# Patient Record
Sex: Male | Born: 1980 | Race: White | Hispanic: No | Marital: Married | Smoking: Former smoker
Health system: Southern US, Community
[De-identification: ages and names within clinical notes are randomized; demographics above are authoritative.]

## PROBLEM LIST (undated history)

## (undated) ENCOUNTER — Emergency Department (HOSPITAL_BASED_OUTPATIENT_CLINIC_OR_DEPARTMENT_OTHER): Admission: EM | Payer: Self-pay | Source: Home / Self Care

## (undated) DIAGNOSIS — J449 Chronic obstructive pulmonary disease, unspecified: Secondary | ICD-10-CM

## (undated) DIAGNOSIS — I10 Essential (primary) hypertension: Secondary | ICD-10-CM

## (undated) DIAGNOSIS — I219 Acute myocardial infarction, unspecified: Secondary | ICD-10-CM

## (undated) HISTORY — PX: CHEST TUBE INSERTION: SHX231

## (undated) HISTORY — PX: ELBOW SURGERY: SHX618

---

## 2013-10-21 ENCOUNTER — Emergency Department
Admission: EM | Admit: 2013-10-21 | Discharge: 2013-10-21 | Disposition: A | Discharge status: Home / Self Care | Payer: Self-pay | Source: Home / Self Care

## 2013-10-21 ENCOUNTER — Encounter: Payer: Self-pay | Admitting: Emergency Medicine

## 2013-10-21 ENCOUNTER — Telehealth: Payer: Self-pay | Admitting: *Deleted

## 2013-10-21 DIAGNOSIS — L03319 Cellulitis of trunk, unspecified: Secondary | ICD-10-CM

## 2013-10-21 DIAGNOSIS — L02211 Cutaneous abscess of abdominal wall: Secondary | ICD-10-CM

## 2013-10-21 DIAGNOSIS — L02219 Cutaneous abscess of trunk, unspecified: Secondary | ICD-10-CM

## 2013-10-21 MED ORDER — DOXYCYCLINE HYCLATE 100 MG PO CAPS: 100.0000 mg | ORAL_CAPSULE | Freq: Two times a day (BID) | ORAL | Status: DC

## 2013-10-21 MED ORDER — HYDROCODONE-ACETAMINOPHEN 5-325 MG PO TABS: 1.0000 | ORAL_TABLET | ORAL | Status: DC | PRN

## 2013-10-21 NOTE — ED Notes (Unsigned)
pt called reports that the redness around his abscess is bigger than when he was here today and he also c/o increased pain. per Dr Orson AloeHenderson advised the pt if he would like he could come back in for an injection of rocephin 1G IM. if significantly worsens proceed to the ED.

## 2013-10-21 NOTE — ED Notes (Signed)
Pt c/o RLQ abd abscess with a red streak to his groin area x 3 days. He reports a fever yesterday. He reports a hx of similar abscesses in the past.

## 2013-10-21 NOTE — ED Provider Notes (Signed)
CSN: [id number]     Arrival date & time 10/21/13  1356 History   None    Chief Complaint  Patient presents with  . Abscess   (Consider location/radiation/quality/duration/timing/severity/associated sxs/prior Treatment) Patient is a 33 y.o. male presenting with abscess. The history is provided by the patient.  Abscess Abscess location: Skin of Right lower abdominal wall. Size:  3 cm Abscess quality: draining, fluctuance, painful, redness and warmth   Red streaking: yes   Duration:  2 days Progression:  Worsening Pain details:    Quality:  Dull and hot   Severity:  Moderate Chronicity:  New Context: not diabetes, not immunosuppression, not insect bite/sting and not skin injury   Relieved by:  Nothing Worsened by:  Draining/squeezing Associated symptoms: fatigue and fever   Associated symptoms: no nausea and no vomiting   Risk factors: prior abscess (Once many years ago that was effectively drained)   Risk factors: no hx of MRSA     History reviewed. No pertinent past medical history. Past Surgical History  Procedure Laterality Date  . Chest tube insertion    . Elbow surgery     History reviewed. No pertinent family history. History  Substance Use Topics  . Smoking status: Former Games developer  . Smokeless tobacco: Not on file  . Alcohol Use: Yes    Review of Systems  Constitutional: Positive for fever and fatigue.  Gastrointestinal: Negative for nausea and vomiting.  All other systems reviewed and are negative.    Allergies  Review of patient's allergies indicates no known allergies.  Home Medications   Current Outpatient Rx  Name  Route  Sig  Dispense  Refill  . doxycycline (VIBRAMYCIN) 100 MG capsule   Oral   Take 1 capsule (100 mg total) by mouth 2 (two) times daily.   20 capsule   0   . HYDROcodone-acetaminophen (NORCO/VICODIN) 5-325 MG per tablet   Oral   Take 1-2 tablets by mouth every 4 (four) hours as needed for severe pain. Take with food.   15  tablet   0    BP 146/92  Pulse 88  Temp(Src) 98.1 F (36.7 C) (Oral)  Resp 18  Ht 5\' 10"  (1.778 m)  Wt 183 lb (83.008 kg)  BMI 26.26 kg/m2  SpO2 99% Physical Exam  Nursing note and vitals reviewed. Constitutional: He is oriented to person, place, and time. He appears well-developed and well-nourished. He is cooperative.  Non-toxic appearance. He appears ill. No distress.  Moderately uncomfortable from skin infection right lower abdomen.  He is ambulatory and can walk on his own  HENT:  Head: Normocephalic and atraumatic.  Eyes: Conjunctivae and EOM are normal. Pupils are equal, round, and reactive to light. No scleral icterus.  Neck: Normal range of motion.  Cardiovascular: Normal rate.   Pulmonary/Chest: Effort normal.  Abdominal: He exhibits no distension.  Musculoskeletal: Normal range of motion. He exhibits no edema and no tenderness.  No leg or calf tenderness or swelling or induration.  Lymphadenopathy:       Right: Inguinal adenopathy present.  Neurological: He is alert and oriented to person, place, and time.  Skin: Skin is warm.     3 cm area of redness, heat, exquisite tenderness, fluctuance.--In the Center is a small pustule. Surrounding this 3 cm area is a 5 cm in diameter area of heat, tenderness, induration. There are a few red streaks, extending another 3 cm but not beyond.   Psychiatric: He has a normal mood and affect.  ED Course  INCISION AND DRAINAGE Date/Time: 10/21/2013 2:28 PM Performed by: Georgina PillionMASSEY, DAVID Authorized by: Georgina PillionMASSEY, DAVID Consent: Verbal consent obtained. Risks and benefits: risks, benefits and alternatives were discussed Consent given by: patient Patient understanding: patient states understanding of the procedure being performed Patient identity confirmed: verbally with patient Time out: Immediately prior to procedure a "time out" was called to verify the correct patient, procedure, equipment, support staff and site/side marked as  required. Type: abscess Body area: trunk Location details: abdomen Local anesthetic: lidocaine 1% with epinephrine Anesthetic total: 4 ml Patient sedated: no Scalpel size: 11 Incision type: single straight Complexity: simple Drainage: purulent and serosanguinous Drainage amount: copious Wound treatment: wound left open Packing material: 1/2 in iodoform gauze Patient tolerance: Patient tolerated the procedure well with no immediate complications.   Labs Review Labs Reviewed  WOUND CULTURE   Imaging Review No results found.  EKG Interpretation    Date/Time:    Ventricular Rate:    PR Interval:    QRS Duration:   QT Interval:    QTC Calculation:   R Axis:     Text Interpretation:              MDM   1. Cutaneous abscess of abdominal wall   2. Cellulitis and abscess of trunk    After risks, benefits, alternatives discussed, patient consented and agreed to incision and drainage, described in detail above. Wound culture sent.   Iodoform gauze placed into the abscess cavity . He tolerated well. Thick gauze dressing applied. Wound care instructions given. Keep clean and dry Return here to urgent care 2 days for recheck and removal/partial removal of iodoform gauze.  I advised shot of Rocephin, but he declined .  Rxs:   Doxycycline 100 mg twice a day x10 days Vicodin. #15. No refills   Precautions discussed. Red flags discussed. Questions invited and answered. Patient voiced understanding and agreement.    Lajean Manesavid Massey, MD 10/21/13 [phone]

## 2013-10-23 ENCOUNTER — Emergency Department
Admission: EM | Admit: 2013-10-23 | Discharge: 2013-10-23 | Disposition: A | Discharge status: Home / Self Care | Payer: Self-pay | Source: Home / Self Care | Attending: Family Medicine | Admitting: Family Medicine

## 2013-10-23 DIAGNOSIS — Z5189 Encounter for other specified aftercare: Secondary | ICD-10-CM

## 2013-10-23 NOTE — ED Provider Notes (Signed)
CSN: [id number]     Arrival date & time 10/23/13  1244 History   First MD Initiated Contact with Patient 10/23/13 1246     Chief Complaint  Patient presents with  . Wound Check    HPI Comments: Pt here for wound recheck.  Had R lower abdomen abscess.  I and D'd at bedside.  Wound cx still pending. No growth today No fevers or chills.  Sxs overall gradually improving.  Mild drainage.   Patient is a 33 y.o. male presenting with wound check. The history is provided by the patient.  Wound Check This is a new problem. The current episode started more than 2 days ago. The problem has been gradually improving.    No past medical history on file. Past Surgical History  Procedure Laterality Date  . Chest tube insertion    . Elbow surgery     No family history on file. History  Substance Use Topics  . Smoking status: Former Games developermoker  . Smokeless tobacco: Not on file  . Alcohol Use: Yes    Review of Systems  All other systems reviewed and are negative.    Allergies  Review of patient's allergies indicates no known allergies.  Home Medications   Current Outpatient Rx  Name  Route  Sig  Dispense  Refill  . doxycycline (VIBRAMYCIN) 100 MG capsule   Oral   Take 1 capsule (100 mg total) by mouth 2 (two) times daily.   20 capsule   0   . HYDROcodone-acetaminophen (NORCO/VICODIN) 5-325 MG per tablet   Oral   Take 1-2 tablets by mouth every 4 (four) hours as needed for severe pain. Take with food.   15 tablet   0    BP 153/90  Pulse 98  Temp(Src) 97.7 F (36.5 C) (Oral)  Resp 14  SpO2 100% Physical Exam  Constitutional: He appears well-developed and well-nourished.  HENT:  Head: Normocephalic and atraumatic.  Eyes: Conjunctivae are normal. Pupils are equal, round, and reactive to light.  Neck: Normal range of motion. Neck supple.  Cardiovascular: Normal rate and regular rhythm.   Pulmonary/Chest: Effort normal and breath sounds normal.  Abdominal:    R lower  abdomen abscess Packing in place.  Mild TTP   Neurological: He is alert.  Skin: Rash noted.    ED Course  Procedures (including critical care time) Labs Review Labs Reviewed - No data to display Imaging Review No results found.  EKG Interpretation    Date/Time:    Ventricular Rate:    PR Interval:    QRS Duration:   QT Interval:    QTC Calculation:   R Axis:     Text Interpretation:              MDM   1. Wound check, abscess    Packing removed and replaced.  Continue doxycycline Plan for follow up her in clinic in 2 days for packing removal/reassessment.  Discussed skin and infectious red flags at length. Follow up as needed.     The patient and/or caregiver has been counseled thoroughly with regard to treatment plan and/or medications prescribed including dosage, schedule, interactions, rationale for use, and possible side effects and they verbalize understanding. Diagnoses and expected course of recovery discussed and will return if not improved as expected or if the condition worsens. Patient and/or caregiver verbalized understanding.         Doree AlbeeSteven Baneen Wieseler, MD 10/23/13 [phone]

## 2013-10-23 NOTE — ED Notes (Signed)
Ronnie Doyle is here for f/u of abscess on lower abdomen. Denies fevr, chills or body aches. Taking antibiotic as prescribed. He has has to change the dressing once due to drainage.

## 2013-10-24 LAB — WOUND CULTURE
Gram Stain: NONE SEEN
Gram Stain: NONE SEEN

## 2013-10-25 ENCOUNTER — Emergency Department (INDEPENDENT_AMBULATORY_CARE_PROVIDER_SITE_OTHER)
Admission: EM | Admit: 2013-10-25 | Discharge: 2013-10-25 | Disposition: A | Discharge status: Home / Self Care | Payer: Self-pay | Source: Home / Self Care | Attending: Family Medicine | Admitting: Family Medicine

## 2013-10-25 ENCOUNTER — Encounter: Payer: Self-pay | Admitting: Emergency Medicine

## 2013-10-25 DIAGNOSIS — Z4801 Encounter for change or removal of surgical wound dressing: Secondary | ICD-10-CM

## 2013-10-25 MED ORDER — DOXYCYCLINE HYCLATE 100 MG PO CAPS: 100.0000 mg | ORAL_CAPSULE | Freq: Two times a day (BID) | ORAL | Status: DC

## 2013-10-25 NOTE — ED Notes (Signed)
For follow up wound check lower abdomen.

## 2013-10-25 NOTE — Discharge Instructions (Signed)
Change bandage daily until healed.   SEEK MEDICAL CARE IF:  Your skin around the wound looks red.  Your wound feels more tender or sore.  You see pus in the wound.  Your wound smells bad.  You have a fever.  Your skin around the wound has a rash that itches and burns.  You see black or yellow skin in your wound that was not there before.  You feel nauseous, throw up, and feel very tired. Document Released: 10/19/2004 Document Revised: 12/04/2011 Document Reviewed: 07/24/2011 [facility] Patient Information 2014 [employer], [place].

## 2013-10-25 NOTE — ED Provider Notes (Signed)
CSN: [id number]     Arrival date & time 10/25/13  1252 History   First MD Initiated Contact with Patient 10/25/13 1310     Chief complaint:  Returns for dressing change    HPI Comments: Patient has no complaints.  He has minimal pain at abscess site.  No fevers, chills, and sweats.  The history is provided by the patient.    No past medical history on file. Past Surgical History  Procedure Laterality Date  . Chest tube insertion    . Elbow surgery     No family history on file. History  Substance Use Topics  . Smoking status: Former Games developermoker  . Smokeless tobacco: Not on file  . Alcohol Use: Yes    Review of Systems Patient denies fevers, chills, and sweats.   Allergies  Review of patient's allergies indicates no known allergies.  Home Medications   Current Outpatient Rx  Name  Route  Sig  Dispense  Refill  . doxycycline (VIBRAMYCIN) 100 MG capsule   Oral   Take 1 capsule (100 mg total) by mouth 2 (two) times daily.   14 capsule   0   . HYDROcodone-acetaminophen (NORCO/VICODIN) 5-325 MG per tablet   Oral   Take 1-2 tablets by mouth every 4 (four) hours as needed for severe pain. Take with food.   15 tablet   0    There were no vitals taken for this visit. Physical Exam Nursing notes and Vital Signs reviewed. Appearance:  Patient appears healthy, stated age, and in no acute distress Eyes:  Pupils are equal, round, and reactive to light and accomodation.  Extraocular movement is intact.  Conjunctivae are not inflamed  Heart:  Regular rate and rhythm without murmurs, rubs, or gallops.  Abdomen:  Nontender without masses or hepatosplenomegaly.  Bowel sounds are present.  No CVA or flank tenderness.  Skin:  Wound right lower abdomen has minimal erythema present.  Removed packing; minimal drainage present.  Wound about 1cm long and 5mm deep.  No need for packing today.  Bandage applied  ED Course  Procedures  none          MDM   1. Dressing change or removal,  surgical wound    Continue doxycycline until wound healed Change bandage daily until healed. Return if worsens.    Lattie HawStephen A Jaloni Sorber, MD 10/27/13 [phone]

## 2013-11-06 ENCOUNTER — Emergency Department (INDEPENDENT_AMBULATORY_CARE_PROVIDER_SITE_OTHER)
Admission: EM | Admit: 2013-11-06 | Discharge: 2013-11-06 | Disposition: A | Discharge status: Home / Self Care | Payer: Self-pay | Source: Home / Self Care | Attending: Family Medicine | Admitting: Family Medicine

## 2013-11-06 ENCOUNTER — Encounter: Payer: Self-pay | Admitting: Emergency Medicine

## 2013-11-06 ENCOUNTER — Telehealth: Payer: Self-pay | Admitting: *Deleted

## 2013-11-06 DIAGNOSIS — L03319 Cellulitis of trunk, unspecified: Secondary | ICD-10-CM

## 2013-11-06 DIAGNOSIS — L02219 Cutaneous abscess of trunk, unspecified: Secondary | ICD-10-CM

## 2013-11-06 DIAGNOSIS — Z5189 Encounter for other specified aftercare: Secondary | ICD-10-CM

## 2013-11-06 DIAGNOSIS — Z4800 Encounter for change or removal of nonsurgical wound dressing: Secondary | ICD-10-CM

## 2013-11-06 LAB — POCT CBC W AUTO DIFF (K'VILLE URGENT CARE)

## 2013-11-06 MED ORDER — DOXYCYCLINE HYCLATE 100 MG PO CAPS: 100.0000 mg | ORAL_CAPSULE | Freq: Two times a day (BID) | ORAL | Status: DC

## 2013-11-06 NOTE — ED Notes (Signed)
Ronnie Doyle was last seen for a f/u of abscess on 10/27/13. Site has healed and closed. He still c/o pain at the site and area of hardness surrounding. Denies fever or chills. Site negative for redness.

## 2013-11-06 NOTE — ED Provider Notes (Signed)
CSN: [id number]     Arrival date & time 11/06/13  1217 History   First MD Initiated Contact with Patient 11/06/13 1222     Chief Complaint  Patient presents with  . Wound Check      HPI  Pt presents today for wound recheck  Initially seen 1/27 for R lower abd abscess.  Was i and d'd. Pt placed on doxy.  Wound cx showed staph sensitive to doxy.  Last seen 1/31. Packing removed.  Pt states that he has had persistent pain and irritation in affected area.  No fevers or chills.   History reviewed. No pertinent past medical history. Past Surgical History  Procedure Laterality Date  . Chest tube insertion    . Elbow surgery     History reviewed. No pertinent family history. History  Substance Use Topics  . Smoking status: Former Games developermoker  . Smokeless tobacco: Not on file  . Alcohol Use: Yes    Review of Systems  All other systems reviewed and are negative.      Allergies  Review of patient's allergies indicates no known allergies.  Home Medications   Current Outpatient Rx  Name  Route  Sig  Dispense  Refill  . doxycycline (VIBRAMYCIN) 100 MG capsule   Oral   Take 1 capsule (100 mg total) by mouth 2 (two) times daily.   14 capsule   0   . HYDROcodone-acetaminophen (NORCO/VICODIN) 5-325 MG per tablet   Oral   Take 1-2 tablets by mouth every 4 (four) hours as needed for severe pain. Take with food.   15 tablet   0    BP 144/97  Pulse 101  Temp(Src) 98.6 F (37 C) (Oral)  Resp 14  SpO2 99% Physical Exam  Constitutional: He appears well-developed and well-nourished.  HENT:  Head: Normocephalic and atraumatic.  Eyes: Conjunctivae are normal. Pupils are equal, round, and reactive to light.  Neck: Normal range of motion. Neck supple.  Cardiovascular: Normal rate and regular rhythm.   Pulmonary/Chest: Effort normal and breath sounds normal.  Abdominal: Soft.    R lower abdominal wound, healed with scar formation, induration, mild peripheral erythema.      Musculoskeletal: Normal range of motion.  Neurological: He is alert.  Skin: Skin is warm.    ED Course  Procedures (including critical care time) Labs Review Labs Reviewed  POCT CBC W AUTO DIFF ([facility])    Imaging Review No results found.    MDM   Final diagnoses:  Wound check, abscess    WBC WNL today @ 6.9 DDx includes scar formation vs. Abscess recurrence.  Infection less likely given normal WBC and no other systemic sxs.  Unclear if there an underlying cyst/scar tissue complicating area.  Will send to surgery for reevaluation  PPx rx for doxy given if pt spikes fever. Hold off in the interim given no systemic sxs.  Pt expressed understanding of plan.  Follow up as needed.     The patient and/or caregiver has been counseled thoroughly with regard to treatment plan and/or medications prescribed including dosage, schedule, interactions, rationale for use, and possible side effects and they verbalize understanding. Diagnoses and expected course of recovery discussed and will return if not improved as expected or if the condition worsens. Patient and/or caregiver verbalized understanding.         Doree AlbeeSteven Nykiah Ma, MD 11/06/13 [phone]

## 2015-09-26 ENCOUNTER — Encounter: Payer: Self-pay | Admitting: Emergency Medicine

## 2015-09-26 ENCOUNTER — Emergency Department (INDEPENDENT_AMBULATORY_CARE_PROVIDER_SITE_OTHER): Payer: BLUE CROSS/BLUE SHIELD

## 2015-09-26 ENCOUNTER — Emergency Department
Admission: EM | Admit: 2015-09-26 | Discharge: 2015-09-26 | Disposition: A | Discharge status: Home / Self Care | Payer: BLUE CROSS/BLUE SHIELD | Source: Home / Self Care | Attending: Family Medicine | Admitting: Family Medicine

## 2015-09-26 DIAGNOSIS — J439 Emphysema, unspecified: Secondary | ICD-10-CM

## 2015-09-26 DIAGNOSIS — J438 Other emphysema: Secondary | ICD-10-CM

## 2015-09-26 DIAGNOSIS — R05 Cough: Secondary | ICD-10-CM

## 2015-09-26 DIAGNOSIS — R053 Chronic cough: Secondary | ICD-10-CM

## 2015-09-26 HISTORY — DX: Essential (primary) hypertension: I10

## 2015-09-26 MED ORDER — PREDNISONE 20 MG PO TABS: ORAL_TABLET | ORAL | Status: DC

## 2015-09-26 MED ORDER — BENZONATATE 100 MG PO CAPS: 100.0000 mg | ORAL_CAPSULE | Freq: Three times a day (TID) | ORAL | Status: DC

## 2015-09-26 MED ORDER — DOXYCYCLINE HYCLATE 100 MG PO CAPS: 100.0000 mg | ORAL_CAPSULE | Freq: Two times a day (BID) | ORAL | Status: DC

## 2015-09-26 MED ORDER — ALBUTEROL SULFATE HFA 108 (90 BASE) MCG/ACT IN AERS: 1.0000 | INHALATION_SPRAY | Freq: Four times a day (QID) | RESPIRATORY_TRACT | Status: AC | PRN

## 2015-09-26 NOTE — ED Notes (Signed)
Pt c/o productive cough x 3 weeks, tight band feeling around lungs, some difficulty breathing, worse in the am and pm.

## 2015-09-26 NOTE — Discharge Instructions (Signed)

## 2015-09-26 NOTE — ED Provider Notes (Signed)
CSN: [id number]     Arrival date & time 09/26/15  1617 History   First MD Initiated Contact with Patient 09/26/15 1631     Chief Complaint  Patient presents with  . Cough   (Consider location/radiation/quality/duration/timing/severity/associated sxs/prior Treatment) HPI Pt is a 35yo male presenting to [facility] with c/o 3 weeks of moderately productive cough with associated chest tightness and shortness of breath.  Cough is worse in the morning and at night, improves throughout the day. He has been trying OTC cough medication without relief. Denies hx of asthma. Pt is a former smoker. He reports a subjective fever. Denies n/v/d.   Past Medical History  Diagnosis Date  . Hypertension    Past Surgical History  Procedure Laterality Date  . Chest tube insertion    . Elbow surgery     No family history on file. Social History  Substance Use Topics  . Smoking status: Former Games developer  . Smokeless tobacco: None  . Alcohol Use: Yes    Review of Systems  Constitutional: Positive for fever, chills and fatigue.  HENT: Positive for congestion, postnasal drip, rhinorrhea, sinus pressure and sore throat. Negative for ear pain, trouble swallowing and voice change.   Respiratory: Positive for cough, chest tightness, shortness of breath and wheezing.   Cardiovascular: Positive for chest pain. Negative for palpitations.  Gastrointestinal: Negative for nausea, vomiting, abdominal pain and diarrhea.  Musculoskeletal: Negative for myalgias, back pain and arthralgias.  Skin: Negative for rash.  Neurological: Positive for dizziness and headaches. Negative for light-headedness.    Allergies  Review of patient's allergies indicates no known allergies.  Home Medications   Prior to Admission medications   Medication Sig Start Date End Date Taking? Authorizing Provider  tapentadol (NUCYNTA) 50 MG TABS tablet Take 50 mg by mouth.   Yes Historical Provider, MD  albuterol (PROVENTIL HFA;VENTOLIN HFA) 108 (90  Base) MCG/ACT inhaler Inhale 1-2 puffs into the lungs every 6 (six) hours as needed for wheezing or shortness of breath. 09/26/15   Ronnie Finner, PA-C  benzonatate (TESSALON) 100 MG capsule Take 1 capsule (100 mg total) by mouth every 8 (eight) hours. 09/26/15   Ronnie Finner, PA-C  doxycycline (VIBRAMYCIN) 100 MG capsule Take 1 capsule (100 mg total) by mouth 2 (two) times daily. One po bid x 7 days 09/26/15   Ronnie Finner, PA-C  predniSONE (DELTASONE) 20 MG tablet 2 tabs po daily x 3 days 09/26/15   Ronnie Finner, PA-C   Meds Ordered and Administered this Visit  Medications - No data to display  BP 153/100 mmHg  Pulse 104  Temp(Src) 98.1 F (36.7 C) (Oral)  Ht 5\' 10"  (1.778 m)  Wt 176 lb 8 oz (80.06 kg)  BMI 25.33 kg/m2  SpO2 100% No data found.   Physical Exam  Constitutional: He appears well-developed and well-nourished.  HENT:  Head: Normocephalic and atraumatic.  Right Ear: Hearing, tympanic membrane, external ear and ear canal normal.  Left Ear: Hearing, external ear and ear canal normal. A middle ear effusion is present.  Nose: Nose normal. Right sinus exhibits no maxillary sinus tenderness and no frontal sinus tenderness. Left sinus exhibits no maxillary sinus tenderness and no frontal sinus tenderness.  Mouth/Throat: Uvula is midline and mucous membranes are normal. Posterior oropharyngeal erythema present. No oropharyngeal exudate, posterior oropharyngeal edema or tonsillar abscesses.  Eyes: Conjunctivae are normal. No scleral icterus.  Neck: Normal range of motion. Neck supple.  Cardiovascular: Regular rhythm and normal heart sounds.  Tachycardia present.  Pulmonary/Chest: Effort normal. No respiratory distress. He has wheezes. He has no rales. He exhibits no tenderness.  Faint expiratory wheeze on exam. No respiratory distress. Able to speak in full sentences.   Abdominal: Soft. Bowel sounds are normal. He exhibits no distension and no mass. There is no tenderness. There is no  rebound and no guarding.  Musculoskeletal: Normal range of motion.  Neurological: He is alert.  Skin: Skin is warm and dry.  Nursing note and vitals reviewed.   ED Course  Procedures (including critical care time)  Labs Review Labs Reviewed - No data to display  Imaging Review Dg Chest 2 View  09/26/2015  CLINICAL DATA:  Productive cough and shortness of breath. EXAM: CHEST  2 VIEW COMPARISON:  None. FINDINGS: The patient has chronic obstructive lung disease with flattening of the diaphragm. No infiltrates or effusions. Heart size and vascularity are normal. No acute osseous abnormality. IMPRESSION: Emphysema.  No acute abnormalities. Electronically Signed   By: Francene BoyersJames  Maxwell M.D.   On: 09/26/2015 17:05     MDM   1. Persistent cough for 3 weeks or longer   2. Other emphysema (HCC)     Pt c/o 3 weeks persistent productive cough. No hx of asthma. Pt is a former smoker.  No respiratory distress on exam. O2 Sat 100% mild tachycardia.   CXR: no evidence of pneumonia, findings c/w emphysema.    Rx: doxycycline, prednisone 40mg  x 3 days, albuterol and tessalon. Pt was unaware of any hx of COPD.  He notes he has his a physical coming up with his new PCP on Jan 11th.  Encouraged pt to f/u with PCP, may call to see if he can make an earlier appointment if symptoms continue to worse. Information packet on inhaler use and emphysema provided.  Patient verbalized understanding and agreement with treatment plan.    Ronnie FinnerErin O'Malley, PA-C 09/27/15 [phone]

## 2016-09-25 ENCOUNTER — Encounter: Payer: Self-pay | Admitting: Emergency Medicine

## 2016-09-25 ENCOUNTER — Emergency Department (INDEPENDENT_AMBULATORY_CARE_PROVIDER_SITE_OTHER): Payer: Self-pay

## 2016-09-25 ENCOUNTER — Emergency Department
Admission: EM | Admit: 2016-09-25 | Discharge: 2016-09-25 | Disposition: A | Discharge status: Home / Self Care | Payer: BLUE CROSS/BLUE SHIELD | Source: Home / Self Care | Attending: Family Medicine | Admitting: Family Medicine

## 2016-09-25 DIAGNOSIS — R053 Chronic cough: Secondary | ICD-10-CM

## 2016-09-25 DIAGNOSIS — R05 Cough: Secondary | ICD-10-CM | POA: Diagnosis not present

## 2016-09-25 MED ORDER — PREDNISONE 20 MG PO TABS: ORAL_TABLET | ORAL | 0 refills | Status: DC

## 2016-09-25 MED ORDER — AZITHROMYCIN 250 MG PO TABS: ORAL_TABLET | ORAL | 0 refills | Status: DC

## 2016-09-25 MED ORDER — BENZONATATE 200 MG PO CAPS: ORAL_CAPSULE | ORAL | 0 refills | Status: DC

## 2016-09-25 NOTE — ED Triage Notes (Signed)
Patient has had cough for over 6 weeks with history of serious lung problems.

## 2016-09-25 NOTE — Discharge Instructions (Signed)
Take plain guaifenesin (1200mg  extended release tabs such as Mucinex) twice daily, with plenty of water, for cough and congestion.  Get adequate rest.     Begin Azithromycin if not improving about one week or if persistent fever develops
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
## 2017-10-16 IMAGING — DX DG CHEST 2V
2 series · 2 of 2 positions shown · non-contrast
Comparison: 09/26/2015

CLINICAL DATA: Cough and congestion

EXAM:
CHEST  2 VIEW

[chest pa]
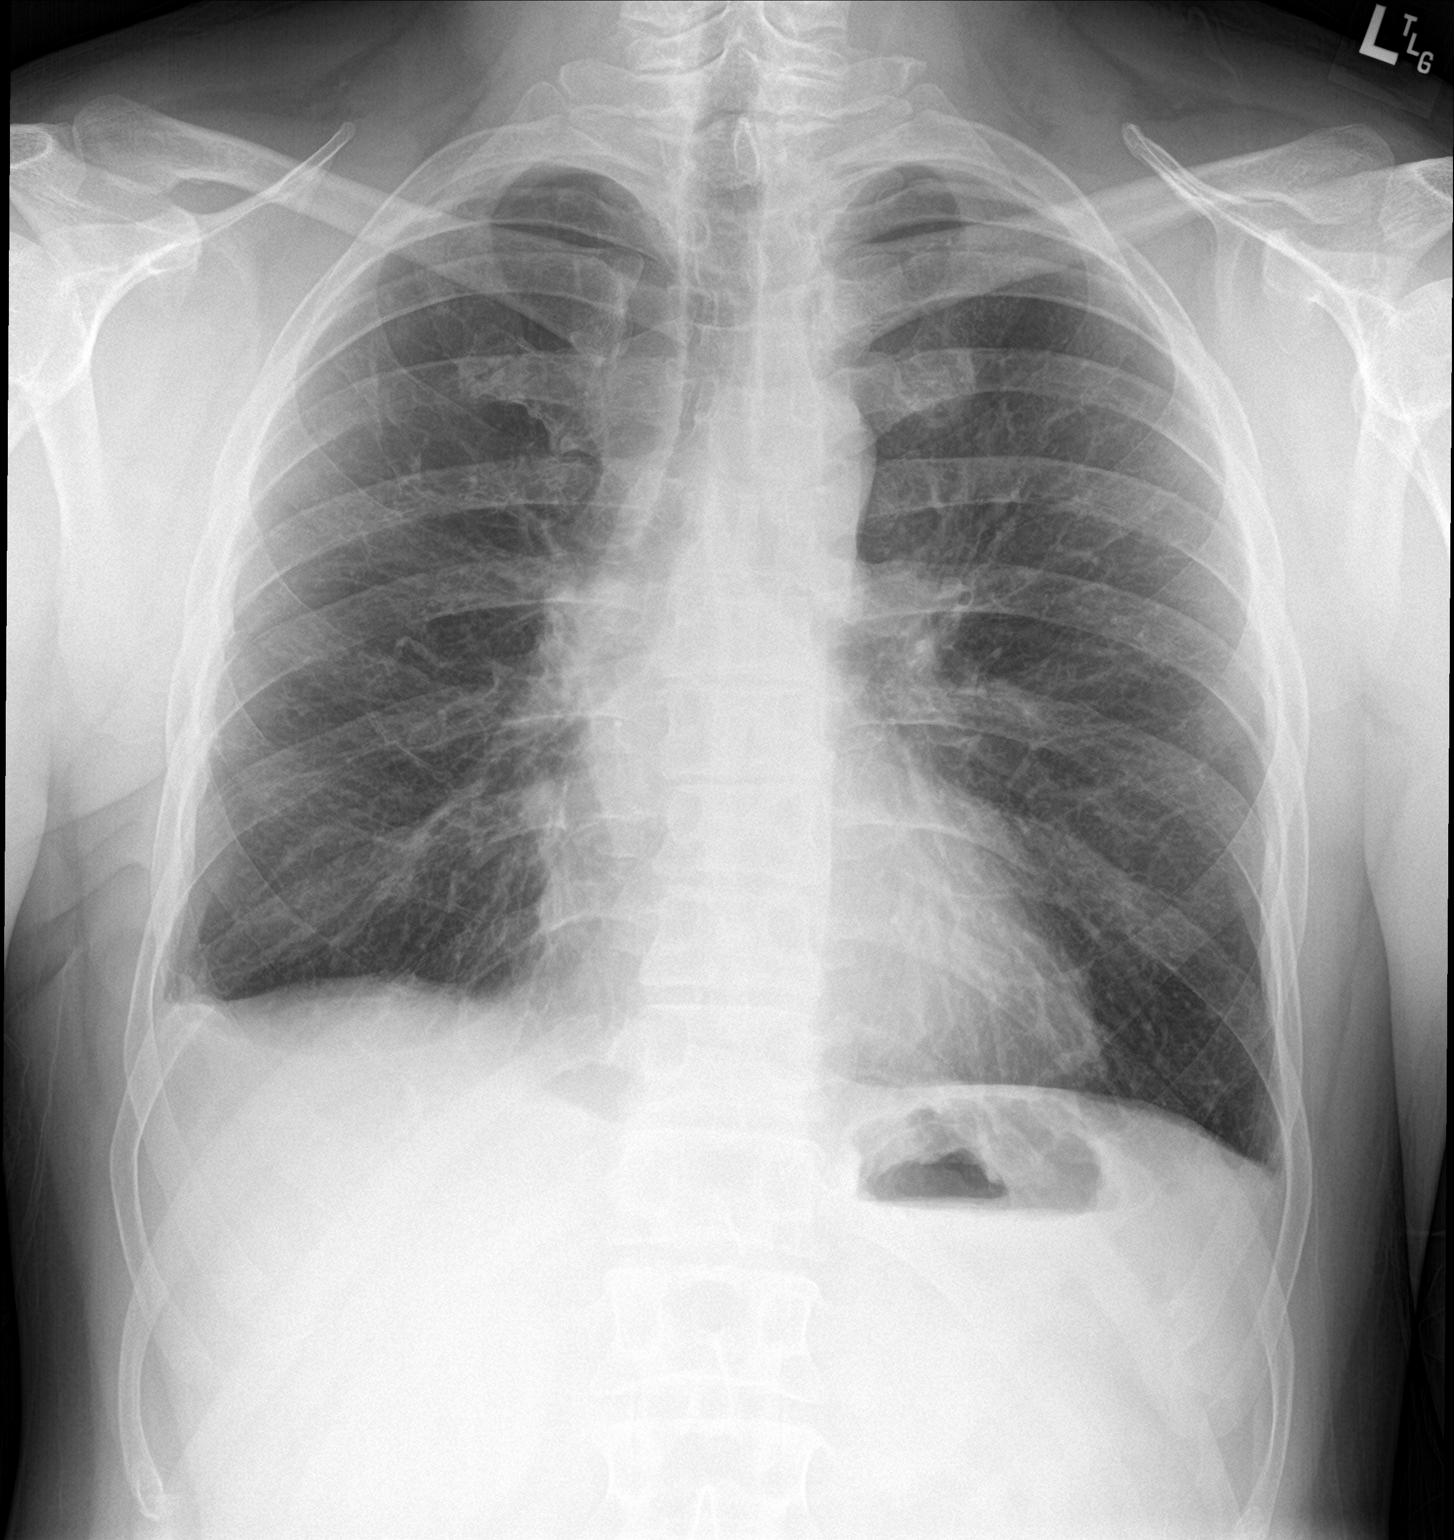

[chest lat]
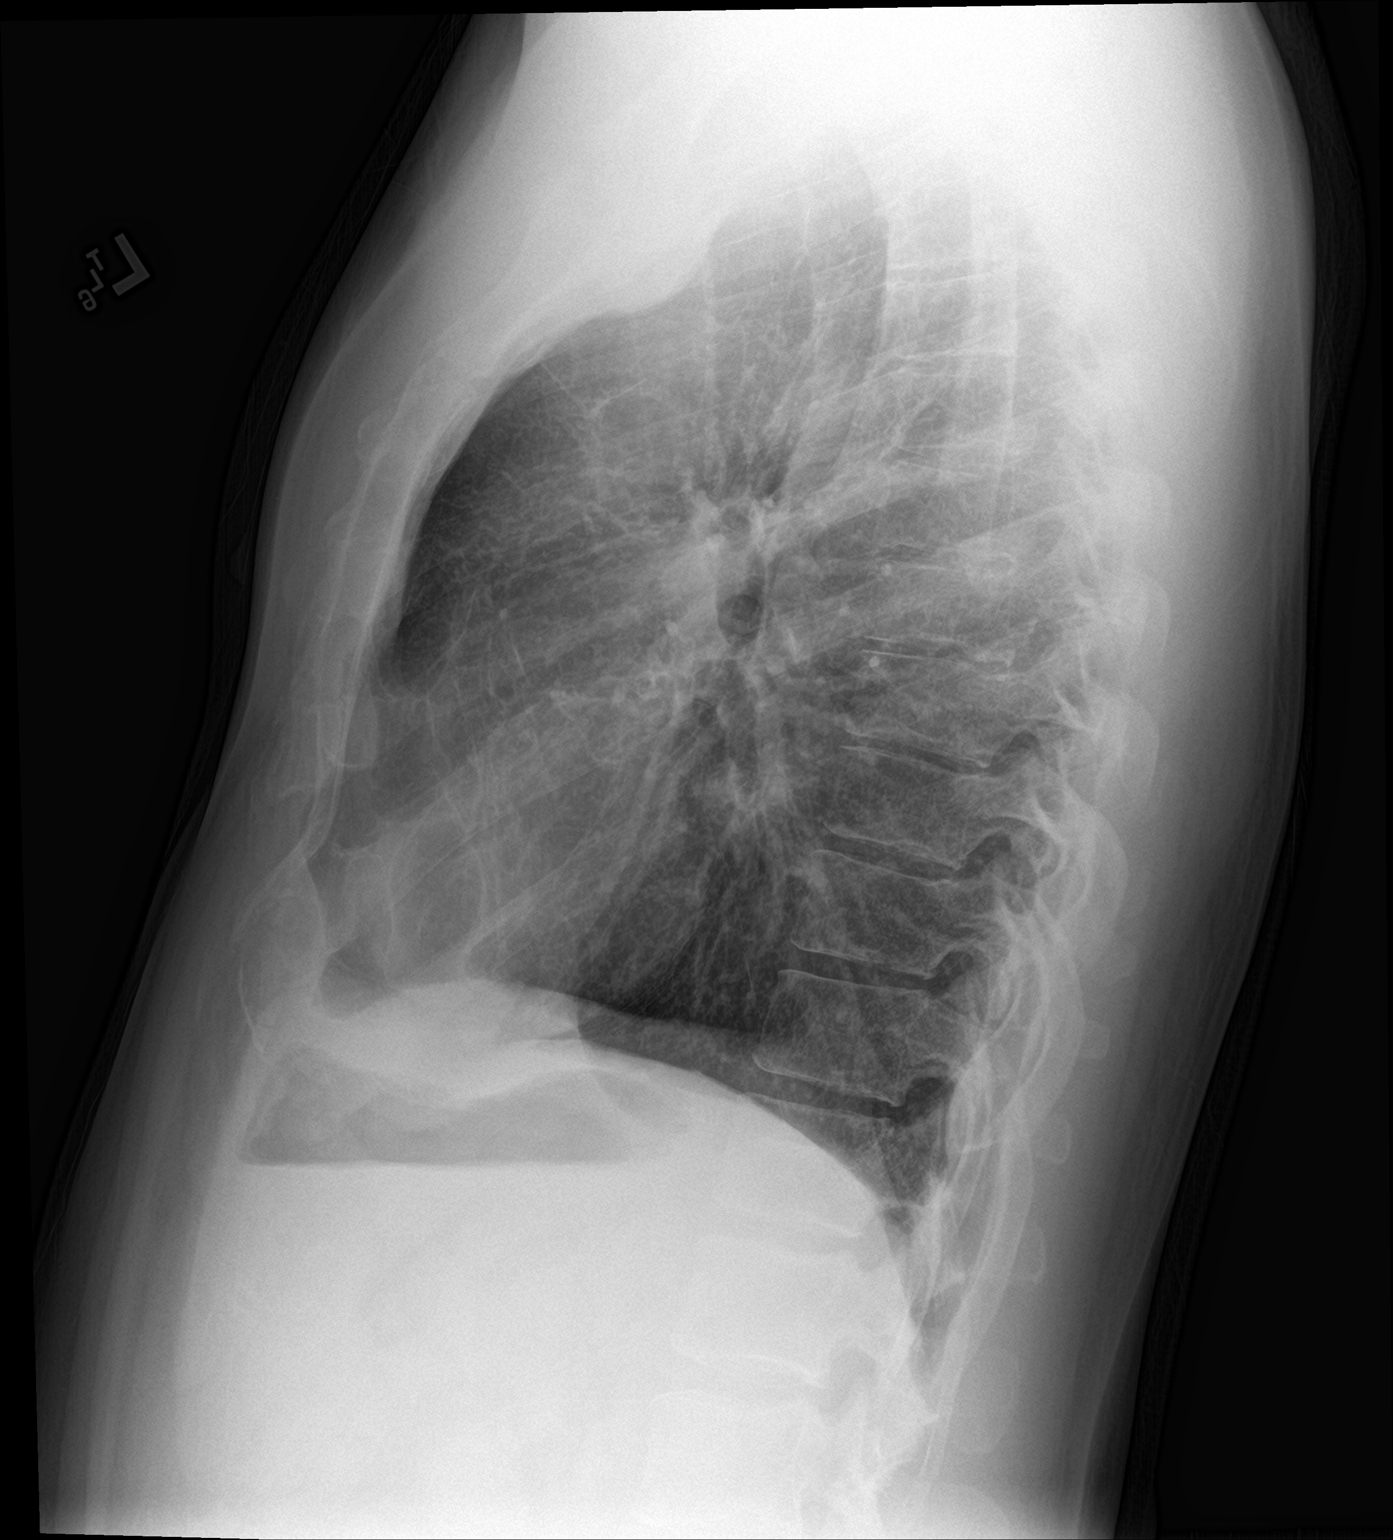

[2 of 2 positions shown; findings below may reference images not displayed]

FINDINGS: Normal cardiac silhouette. No effusion, infiltrate for pneumothorax.
Lungs are hyperinflated. No acute osseous abnormality.
IMPRESSION: Hyperinflated lungs.  No acute findings.

## 2021-04-29 ENCOUNTER — Other Ambulatory Visit: Payer: Self-pay

## 2021-04-29 ENCOUNTER — Emergency Department
Admission: EM | Admit: 2021-04-29 | Discharge: 2021-04-29 | Disposition: A | Discharge status: Home / Self Care | Payer: Self-pay | Source: Home / Self Care | Attending: Family Medicine | Admitting: Family Medicine

## 2021-04-29 DIAGNOSIS — J441 Chronic obstructive pulmonary disease with (acute) exacerbation: Secondary | ICD-10-CM

## 2021-04-29 DIAGNOSIS — J069 Acute upper respiratory infection, unspecified: Secondary | ICD-10-CM

## 2021-04-29 HISTORY — DX: Chronic obstructive pulmonary disease, unspecified: J44.9

## 2021-04-29 HISTORY — DX: Acute myocardial infarction, unspecified: I21.9

## 2021-04-29 MED ORDER — DOXYCYCLINE HYCLATE 100 MG PO CAPS: ORAL_CAPSULE | ORAL | 0 refills | Status: AC

## 2021-04-29 MED ORDER — PREDNISONE 20 MG PO TABS: ORAL_TABLET | ORAL | 0 refills | Status: AC

## 2021-04-29 NOTE — Discharge Instructions (Signed)
Take plain guaifenesin (1200mg  extended release tabs such as Mucinex) twice daily, with plenty of water, for cough and congestion.  Get adequate rest.   May take Delsym Cough Suppressant ("12 Hour Cough Relief") at bedtime for nighttime cough.  Use albuterol inhaler as needed. Stop all antihistamines for now, and other non-prescription cough/cold preparations.
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
## 2021-04-29 NOTE — ED Triage Notes (Signed)
Wednesday felt cruddy. Congestion, fatigue, some cough.   No meds taken   Has not taken a COVID test.   Has gone to work since symptoms started.

## 2021-04-30 LAB — SARS-COV-2 RNA,(COVID-19) QUALITATIVE NAAT: SARS CoV2 RNA: NOT DETECTED

## 2021-05-03 ENCOUNTER — Telehealth: Payer: Self-pay | Admitting: Emergency Medicine

## 2021-05-03 NOTE — Telephone Encounter (Signed)
Call back to pt requesting COVID test results - voice mail left - test results are negative
